# Patient Record
Sex: Male | Born: 1995 | Race: White | Hispanic: No | Marital: Single | Smoking: Never smoker
Health system: Southern US, Community
[De-identification: ages and names within clinical notes are randomized; demographics above are authoritative.]

## PROBLEM LIST (undated history)

## (undated) DIAGNOSIS — F909 Attention-deficit hyperactivity disorder, unspecified type: Secondary | ICD-10-CM

## (undated) DIAGNOSIS — R569 Unspecified convulsions: Secondary | ICD-10-CM

---

## 2013-07-23 ENCOUNTER — Emergency Department: Payer: Self-pay | Admitting: Internal Medicine

## 2014-05-19 ENCOUNTER — Ambulatory Visit: Payer: Self-pay | Admitting: Nurse Practitioner

## 2015-08-09 ENCOUNTER — Emergency Department
Admission: EM | Admit: 2015-08-09 | Discharge: 2015-08-09 | Disposition: A | Discharge status: Home / Self Care | Payer: Commercial Managed Care - PPO | Attending: Emergency Medicine | Admitting: Emergency Medicine

## 2015-08-09 ENCOUNTER — Encounter: Payer: Self-pay | Admitting: Emergency Medicine

## 2015-08-09 ENCOUNTER — Emergency Department: Payer: Commercial Managed Care - PPO

## 2015-08-09 DIAGNOSIS — S00512A Abrasion of oral cavity, initial encounter: Secondary | ICD-10-CM | POA: Diagnosis not present

## 2015-08-09 DIAGNOSIS — X58XXXA Exposure to other specified factors, initial encounter: Secondary | ICD-10-CM | POA: Insufficient documentation

## 2015-08-09 DIAGNOSIS — F1310 Sedative, hypnotic or anxiolytic abuse, uncomplicated: Secondary | ICD-10-CM | POA: Insufficient documentation

## 2015-08-09 DIAGNOSIS — R569 Unspecified convulsions: Secondary | ICD-10-CM

## 2015-08-09 DIAGNOSIS — Y9289 Other specified places as the place of occurrence of the external cause: Secondary | ICD-10-CM | POA: Diagnosis not present

## 2015-08-09 DIAGNOSIS — Y9389 Activity, other specified: Secondary | ICD-10-CM | POA: Insufficient documentation

## 2015-08-09 DIAGNOSIS — F1210 Cannabis abuse, uncomplicated: Secondary | ICD-10-CM | POA: Insufficient documentation

## 2015-08-09 DIAGNOSIS — R51 Headache: Secondary | ICD-10-CM | POA: Insufficient documentation

## 2015-08-09 DIAGNOSIS — Y998 Other external cause status: Secondary | ICD-10-CM | POA: Diagnosis not present

## 2015-08-09 DIAGNOSIS — N289 Disorder of kidney and ureter, unspecified: Secondary | ICD-10-CM

## 2015-08-09 DIAGNOSIS — S1083XA Contusion of other specified part of neck, initial encounter: Secondary | ICD-10-CM | POA: Insufficient documentation

## 2015-08-09 HISTORY — DX: Attention-deficit hyperactivity disorder, unspecified type: F90.9

## 2015-08-09 HISTORY — DX: Unspecified convulsions: R56.9

## 2015-08-09 LAB — CBC
HEMATOCRIT: 46.7 % (ref 40.0–52.0)
HEMOGLOBIN: 15.5 g/dL (ref 13.0–18.0)
MCH: 30.2 pg (ref 26.0–34.0)
MCHC: 33.2 g/dL (ref 32.0–36.0)
MCV: 91 fL (ref 80.0–100.0)
Platelets: 217 10*3/uL (ref 150–440)
RBC: 5.13 MIL/uL (ref 4.40–5.90)
RDW: 13.4 % (ref 11.5–14.5)
WBC: 14.7 10*3/uL — AB (ref 3.8–10.6)

## 2015-08-09 LAB — COMPREHENSIVE METABOLIC PANEL
ALK PHOS: 81 U/L (ref 38–126)
ALT: 15 U/L — AB (ref 17–63)
AST: 21 U/L (ref 15–41)
Albumin: 5.1 g/dL — ABNORMAL HIGH (ref 3.5–5.0)
Anion gap: 16 — ABNORMAL HIGH (ref 5–15)
BILIRUBIN TOTAL: 0.8 mg/dL (ref 0.3–1.2)
BUN: 15 mg/dL (ref 6–20)
CO2: 19 mmol/L — ABNORMAL LOW (ref 22–32)
CREATININE: 1.56 mg/dL — AB (ref 0.61–1.24)
Calcium: 9.5 mg/dL (ref 8.9–10.3)
Chloride: 103 mmol/L (ref 101–111)
GFR calc Af Amer: >60 mL/min (ref >60)
Glucose, Bld: 101 mg/dL — ABNORMAL HIGH (ref 65–99)
Potassium: 4.2 mmol/L (ref 3.5–5.1)
Sodium: 138 mmol/L (ref 135–145)
TOTAL PROTEIN: 7.8 g/dL (ref 6.5–8.1)

## 2015-08-09 LAB — URINE DRUG SCREEN, QUALITATIVE (ARMC ONLY)
AMPHETAMINES, UR SCREEN: NOT DETECTED
BENZODIAZEPINE, UR SCRN: POSITIVE — AB
Barbiturates, Ur Screen: NOT DETECTED
Cannabinoid 50 Ng, Ur ~~LOC~~: POSITIVE — AB
Cocaine Metabolite,Ur ~~LOC~~: NOT DETECTED
MDMA (Ecstasy)Ur Screen: NOT DETECTED
METHADONE SCREEN, URINE: NOT DETECTED
OPIATE, UR SCREEN: NOT DETECTED
Phencyclidine (PCP) Ur S: NOT DETECTED
TRICYCLIC, UR SCREEN: NOT DETECTED

## 2015-08-09 LAB — URINALYSIS COMPLETE WITH MICROSCOPIC (ARMC ONLY)
BILIRUBIN URINE: NEGATIVE
GLUCOSE, UA: NEGATIVE mg/dL
Nitrite: NEGATIVE
PH: 5 (ref 5.0–8.0)
Protein, ur: 100 mg/dL — AB
SQUAMOUS EPITHELIAL / LPF: NONE SEEN
Specific Gravity, Urine: 1.02 (ref 1.005–1.030)

## 2015-08-09 LAB — ETHANOL: Alcohol, Ethyl (B): <5 mg/dL (ref <5)

## 2015-08-09 MED ORDER — SODIUM CHLORIDE 0.9 % IV BOLUS (SEPSIS)
1000.0000 mL | Freq: Once | INTRAVENOUS | Status: AC
Start: 2015-08-09 — End: 2015-08-09
  Administered 2015-08-09: 1000 mL via INTRAVENOUS

## 2015-08-09 MED ORDER — IBUPROFEN 800 MG PO TABS
800.0000 mg | ORAL_TABLET | Freq: Once | ORAL | Status: AC
  Administered 2015-08-09: 800 mg via ORAL
  Filled 2015-08-09: qty 1

## 2015-08-09 MED ORDER — ACETAMINOPHEN 500 MG PO TABS
1000.0000 mg | ORAL_TABLET | Freq: Once | ORAL | Status: AC
  Administered 2015-08-09: 1000 mg via ORAL
  Filled 2015-08-09: qty 2

## 2015-08-09 MED ORDER — DIAZEPAM 10 MG RE GEL: 10.0000 mg | Freq: Once | RECTAL | Status: AC

## 2015-08-09 NOTE — ED Notes (Signed)
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 Pt A&O, states he has only had 1 previous seizure in past when he was younger. Pt C/o headache. Pt denies any drug use , admits to maruijana "every once in a while"

## 2015-08-09 NOTE — Discharge Instructions (Signed)
Please take seizure precautions as we discussed. Please get plenty of rest, drink plenty of fluids to prevent dehydration, and eats small regular meals throughout the day.  Please have your primary care physician recheck your kidney function, which was abnormal today likely due to your seizure.  Return to the emergency department for seizure, fever, severe headache, changes in mental status, numbness tingling or weakness, or any other symptoms concerning to you.

## 2015-08-09 NOTE — ED Notes (Signed)
MD at bedside. 

## 2015-08-09 NOTE — ED Provider Notes (Signed)
[facility] Emergency Department Provider Note  ____________________________________________  Time seen: Approximately 5:30 PM  I have reviewed the triage vital signs and the nursing notes.   HISTORY  Chief Complaint Seizures    HPI Albert Haley is a 20 y.o. male the history of one previous seizure, ADHD on Adderall, presenting with seizure. Patient was in class when he had a sensation "like I was falling asleep" and proceeded to have a proximal line 5 minutes of reported tonic-clonic activity followed by confusion. He fell down and bit the right side of his tongue. He does report occasional marijuana use, last used 3 days ago. He also has recently had a breakup with a girlfriend and has had significantly decreased sleep. He denies any other illnesses including cough or cold symptoms, nausea vomiting or diarrhea, abdominal pain, fever or chills. The patient has a history of a single seizure in elementary school and has never been on any antiepileptic medications in the past.Patient feels tired and has a mild headache at this time.  Fh: No family history of seizures Sh: Positive marijuana, negative cocaine or heroin, negative alcohol use; take his Adderall as prescribed.   Past Medical History  Diagnosis Date  . Seizures (HCC)   . ADHD (attention deficit hyperactivity disorder)     There are no active problems to display for this patient.   History reviewed. No pertinent past surgical history.  Current Outpatient Rx  Name  Route  Sig  Dispense  Refill  . diazepam (DIASTAT ACUDIAL) 10 MG GEL   Rectal   Place 10 mg rectally once.   1 Package   0     Allergies Review of patient's allergies indicates no known allergies.  History reviewed. No pertinent family history.  Social History Social History  Substance Use Topics  . Smoking status: Never Smoker   . Smokeless tobacco: None  . Alcohol Use: Yes     Comment: rare    Review of  Systems Constitutional: No fever/chills. Decreased sleep. Eyes: No visual changes. ENT: No sore throat. Positive abrasion to the right lateral tongue. Cardiovascular: Denies chest pain, palpitations. Respiratory: Denies shortness of breath.  No cough. Gastrointestinal: No abdominal pain.  No nausea, no vomiting.  No diarrhea.  No constipation. Genitourinary: Negative for dysuria. Musculoskeletal: Negative for back pain. Skin: Negative for rash. Neurological: positive for headaches, without focal weakness or numbness.  10-point ROS otherwise negative.  ____________________________________________   PHYSICAL EXAM:  VITAL SIGNS: ED Triage Vitals  Enc Vitals Group     BP 08/09/15 1713 145/56 mmHg     Pulse Rate 08/09/15 1713 98     Resp 08/09/15 1713 16     Temp --      Temp src --      SpO2 08/09/15 1713 100 %     Weight 08/09/15 1713 165 lb (74.844 kg)     Height 08/09/15 1713  (1.854 m)     Head Cir --      Peak Flow --      Pain Score 08/09/15 1714 4     Pain Loc --      Pain Edu? --      Excl. in GC? --     Constitutional: Alert and oriented. Well appearing and in no acute distress. Answer question appropriately. Eyes: Conjunctivae are normal.  EOMI. no scleral icterus. PERRLA. Head: Atraumatic. NECK: Left neck has mild bruising. No midline tenderness to palpation, step-offs or deformities in the cervical  spine. Full range of motion without pain. Nose: No congestion/rhinnorhea. Mouth/Throat: Mucous membranes are moist. Right lateral tongue has a minimal abrasion without laceration. Hemostatic. Neck: No stridor.  Supple.   Cardiovascular: Normal rate, regular rhythm. No murmurs, rubs or gallops.  Respiratory: Normal respiratory effort.  No retractions. Lungs CTAB.  No wheezes, rales or ronchi. Gastrointestinal: Soft and nontender. No distention. No peritoneal signs. Musculoskeletal: No LE edema.  Neurologic:  Alert and oriented 3. Face and smile are symmetric.  EOMI and PERRLA. Moves all extremities well.  Skin:  Skin is warm, dry and intact. No rash noted. Psychiatric: Mood and affect are normal. Speech and behavior are normal.  Normal judgement.  ____________________________________________   LABS (all labs ordered are listed, but only abnormal results are displayed)  Labs Reviewed  CBC - Abnormal; Notable for the following:    WBC 14.7 (*)    All other components within normal limits  COMPREHENSIVE METABOLIC PANEL - Abnormal; Notable for the following:    CO2 19 (*)    Glucose, Bld 101 (*)    Creatinine, Ser 1.56 (*)    Albumin 5.1 (*)    ALT 15 (*)    Anion gap 16 (*)    All other components within normal limits  ETHANOL  URINALYSIS COMPLETEWITH MICROSCOPIC ([facility] ONLY)  URINE DRUG SCREEN, QUALITATIVE ([facility] ONLY)   ____________________________________________  EKG  ED ECG REPORT I, Rockne Menghini, the attending physician, personally viewed and interpreted this ECG.   Date: 08/09/2015  EKG Time: 1719  Rate: 89  Rhythm: normal sinus rhythm  Axis: Normal  Intervals:none  ST&T Change: Nonspecific T-wave inversion in V1.  ____________________________________________  RADIOLOGY  Ct Head Wo Contrast  08/09/2015  CLINICAL DATA:  Seizure and headache. EXAM: CT HEAD WITHOUT CONTRAST TECHNIQUE: Contiguous axial images were obtained from the base of the skull through the vertex without intravenous contrast. COMPARISON:  None. FINDINGS: Sinuses/Soft tissues: Mucosal thickening of ethmoid air cells and sphenoid sinus. Clear frontal sinuses. Clear mastoid air cells. Intracranial: No mass lesion, hemorrhage, hydrocephalus, acute infarct, intra-axial, or extra-axial fluid collection. IMPRESSION: 1.  No acute intracranial abnormality. 2. Mild sinus disease. Electronically Signed   By: Jeronimo Greaves M.D.   On: 08/09/2015 18:23    ____________________________________________   PROCEDURES  Procedure(s) performed: None  Critical  Care performed: No ____________________________________________   INITIAL IMPRESSION / ASSESSMENT AND PLAN / ED COURSE  Pertinent labs & imaging results that were available during my care of the patient were reviewed by me and considered in my medical decision making (see chart for details).  20 y.o. male with a history of a single seizure in early childhood presenting with 5 minute tonic-clonic seizure in the setting of recent marijuana use as well as decreased sleep. The patient likely has a lower seizure threshold and had a seizure today. He is now back to normal mental status. I would check basic labs, urinalysis, and CT his head given that he has a headache. If his workup in the emergency department is negative, he will need follow-up with neurology. I will contact them to discuss initiating antiepileptics today.  ----------------------------------------- 6:45 PM on 08/09/2015 -----------------------------------------  The patient continues to be hemodynamically stable and has normal mental status. He has no focal neurologic findings on exam. His labs are reassuring; his elevated white blood cell count and mild renal insufficiency are likely due to his seizure. I will make sure that he has these followed up by his primary care physician.  I  spoken with Dr. Thad Ranger, who is the neurologist on-call today, and she does not recommend starting antiepileptics at this time given that he had a seizure greater than 10 years ago. I we'll discharge the patient home with Valium suppositories to be used when necessary, and have him follow-up with Dr. Clelia Croft, the outpatient neurologist. Plan discharge.  ____________________________________________  FINAL CLINICAL IMPRESSION(S) / ED DIAGNOSES  Final diagnoses:  Seizure (HCC)  Renal insufficiency      NEW MEDICATIONS STARTED DURING THIS VISIT:  New Prescriptions   DIAZEPAM (DIASTAT ACUDIAL) 10 MG GEL    Place 10 mg rectally once.      Rockne Menghini, MD 08/09/15 [phone]

## 2015-08-09 NOTE — ED Notes (Signed)
Patient transported to CT 

## 2017-06-07 IMAGING — CT CT HEAD W/O CM
1 series · 16 of 30 positions shown, 20 images · non-contrast
Comparison: None.

CLINICAL DATA: Seizure and headache.

EXAM:
CT HEAD WITHOUT CONTRAST
TECHNIQUE: Contiguous axial images were obtained from the base of the skull
through the vertex without intravenous contrast.

[Series 2: head wo · axial · 0.40mm/px · z∈[+265,+391]mm · 16 of 32 slices shown, 20 images]
[im 2/32  brain]
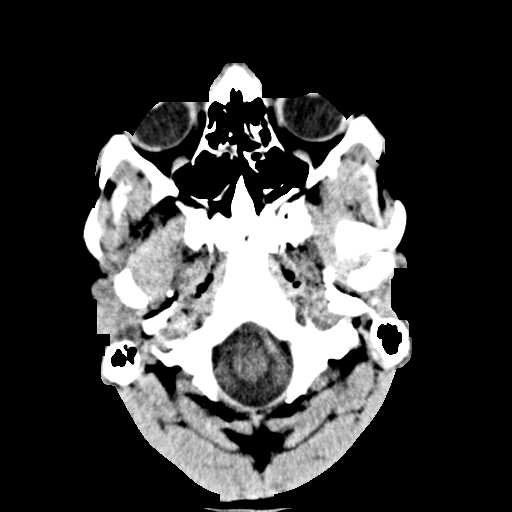
[im 2/32  bone]
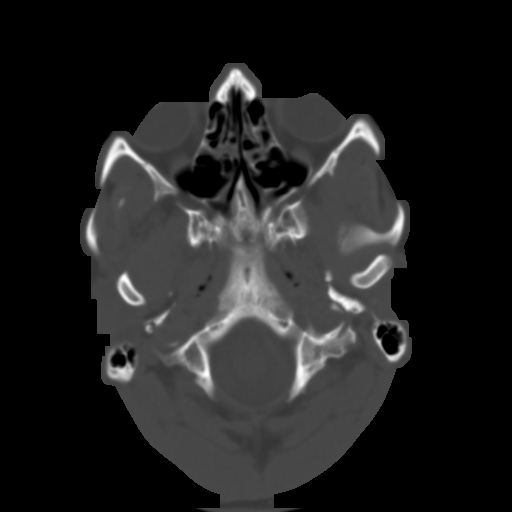
[im 4/32  brain]
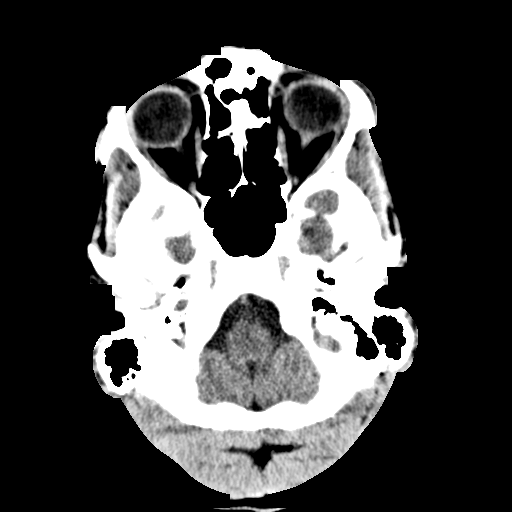
[im 6/32  brain]
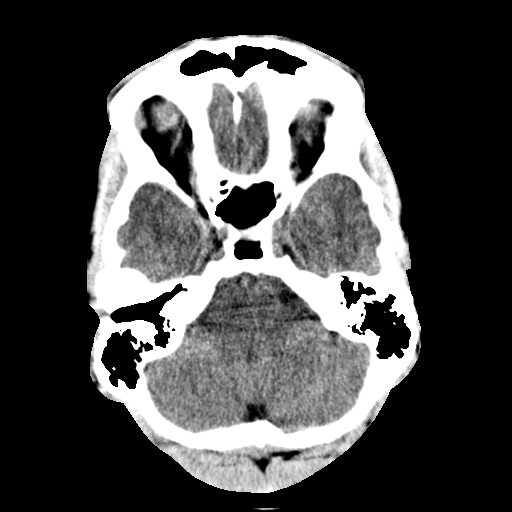
[im 8/32  brain]
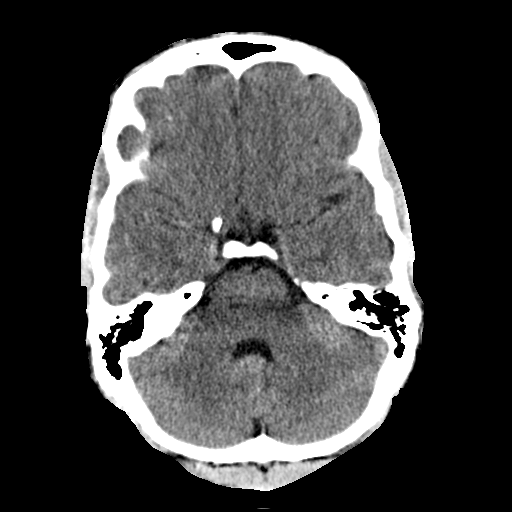
[im 9/32  brain]
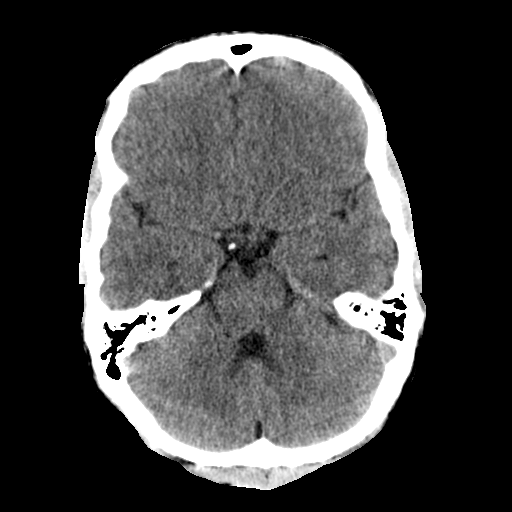
[im 9/32  bone]
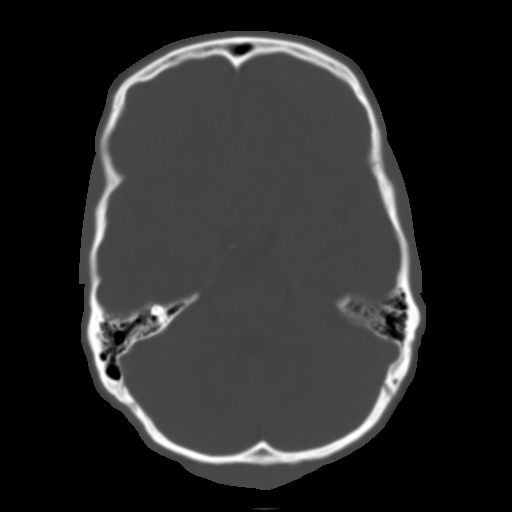
[im 11/32  brain]
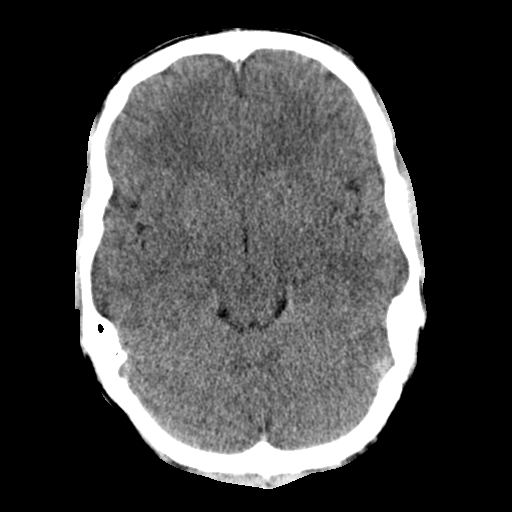
[im 13/32  brain]
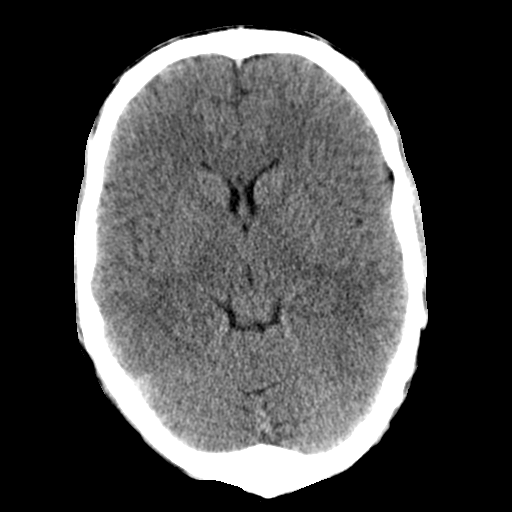
[im 15/32  brain]
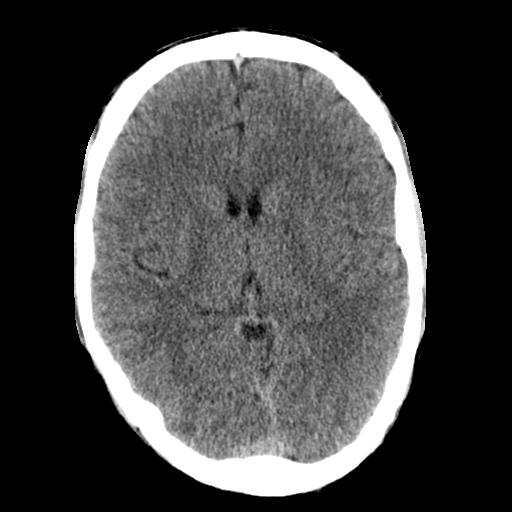
[im 17/32  brain]
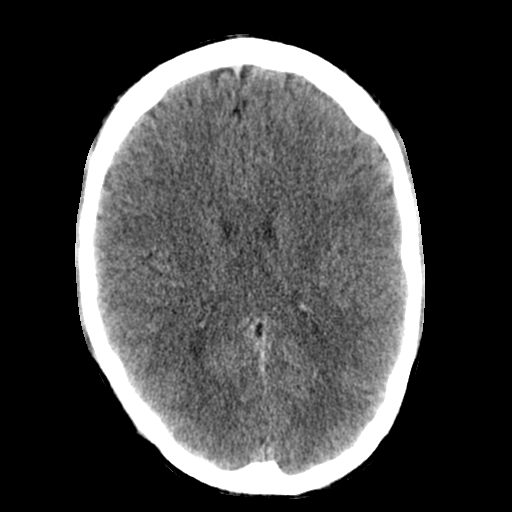
[im 17/32  bone]
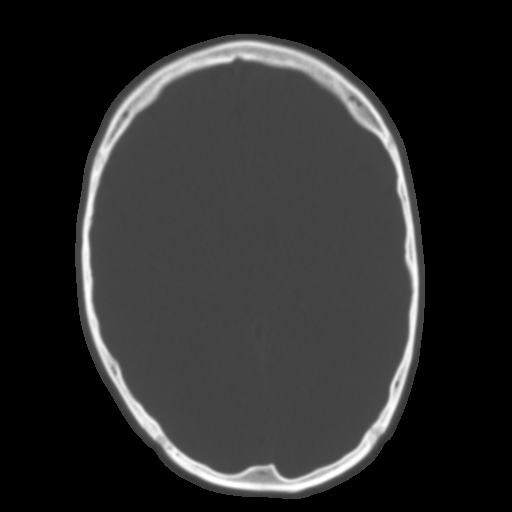
[im 19/32  brain]
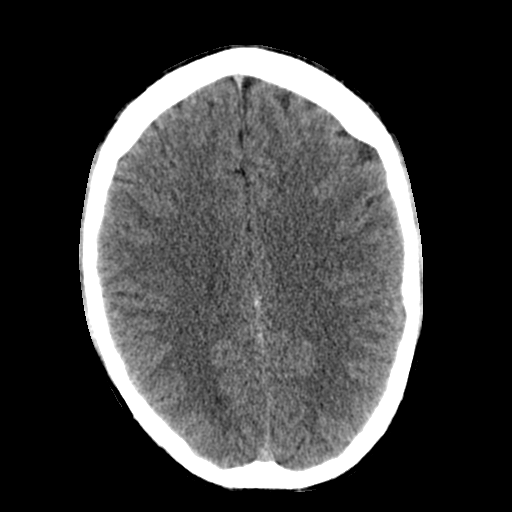
[im 21/32  brain]
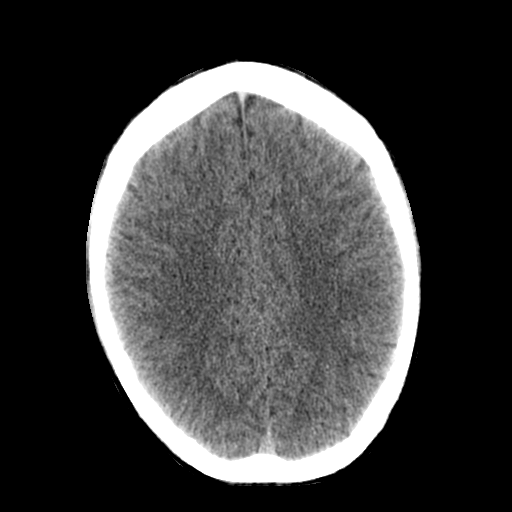
[im 23/32  brain]
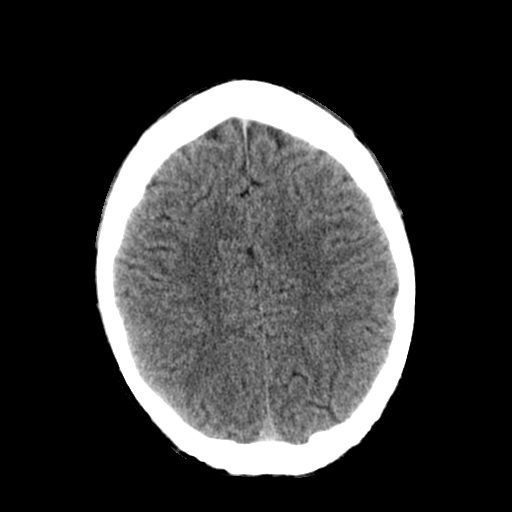
[im 24/32  brain]
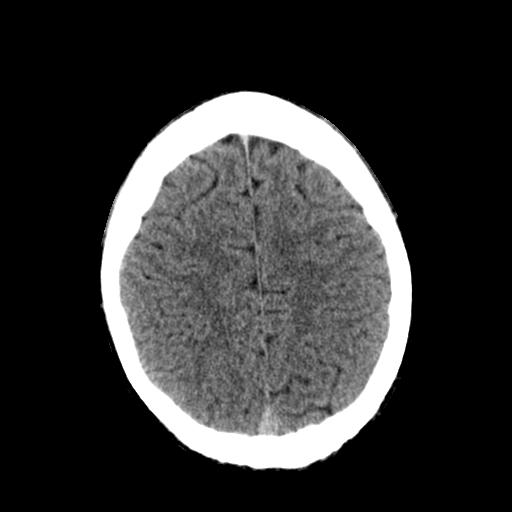
[im 24/32  bone]
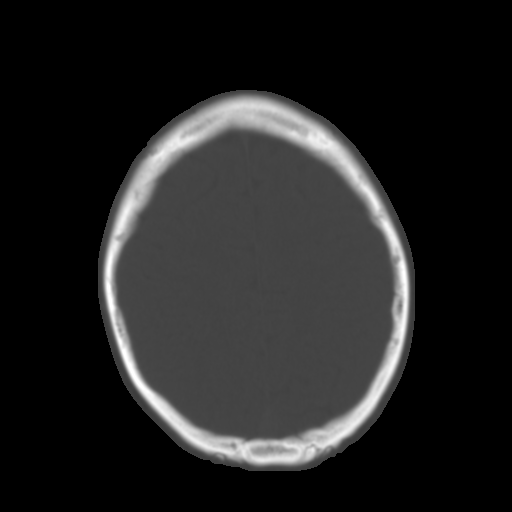
[im 26/32  brain]
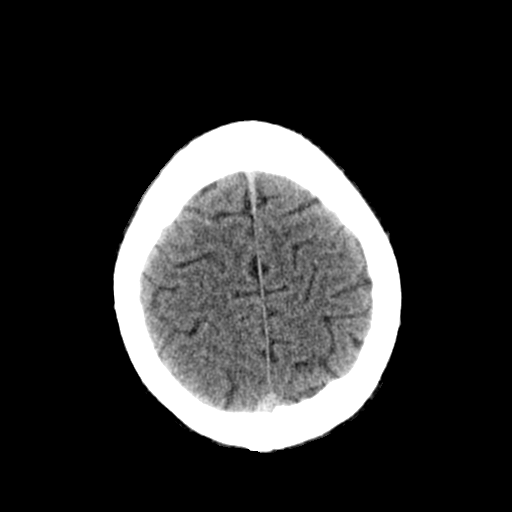
[im 28/32  brain]
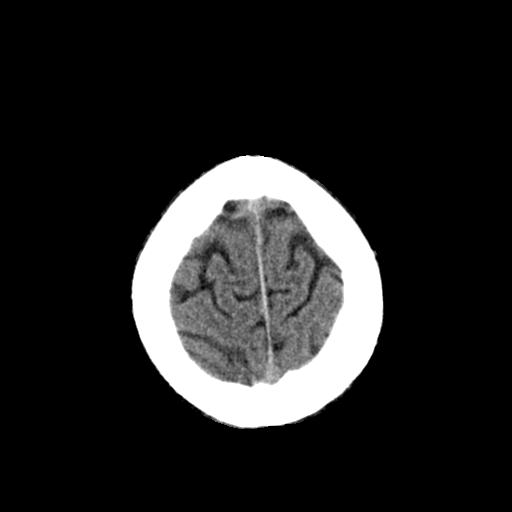
[im 30/32  brain]
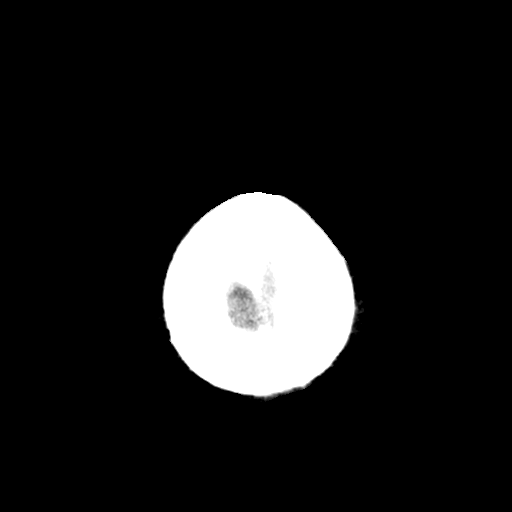

[16 of 30 positions shown; findings below may reference images not displayed]

FINDINGS: Sinuses/Soft tissues: Mucosal thickening of ethmoid air cells and
sphenoid sinus. Clear frontal sinuses. Clear mastoid air cells.

Intracranial: No mass lesion, hemorrhage, hydrocephalus, acute
infarct, intra-axial, or extra-axial fluid collection.
IMPRESSION: 1.  No acute intracranial abnormality.
2. Mild sinus disease.
